# Patient Record
Sex: Male | Born: 2000 | Race: White | Hispanic: No | Marital: Single | Smoking: Current every day smoker
Health system: Southern US, Community
[De-identification: ages and names within clinical notes are randomized; demographics above are authoritative.]

## PROBLEM LIST (undated history)

## (undated) DIAGNOSIS — F909 Attention-deficit hyperactivity disorder, unspecified type: Secondary | ICD-10-CM

---
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
## 2023-04-09 ENCOUNTER — Ambulatory Visit
Admission: EM | Admit: 2023-04-09 | Discharge: 2023-04-09 | Disposition: A | Discharge status: Home / Self Care | Payer: Self-pay | Attending: Urgent Care | Admitting: Urgent Care

## 2023-04-09 DIAGNOSIS — F17210 Nicotine dependence, cigarettes, uncomplicated: Secondary | ICD-10-CM | POA: Insufficient documentation

## 2023-04-09 DIAGNOSIS — F17200 Nicotine dependence, unspecified, uncomplicated: Secondary | ICD-10-CM

## 2023-04-09 DIAGNOSIS — R112 Nausea with vomiting, unspecified: Secondary | ICD-10-CM | POA: Insufficient documentation

## 2023-04-09 DIAGNOSIS — Z1152 Encounter for screening for COVID-19: Secondary | ICD-10-CM | POA: Insufficient documentation

## 2023-04-09 DIAGNOSIS — J209 Acute bronchitis, unspecified: Secondary | ICD-10-CM | POA: Insufficient documentation

## 2023-04-09 HISTORY — DX: Attention-deficit hyperactivity disorder, unspecified type: F90.9

## 2023-04-09 LAB — POCT INFLUENZA A/B
Influenza A, POC: NEGATIVE
Influenza B, POC: NEGATIVE

## 2023-04-09 MED ORDER — ONDANSETRON 8 MG PO TBDP: 8.0000 mg | ORAL_TABLET | Freq: Three times a day (TID) | ORAL | 0 refills | Status: AC | PRN

## 2023-04-09 MED ORDER — ACETAMINOPHEN 325 MG PO TABS: 650.0000 mg | ORAL_TABLET | Freq: Four times a day (QID) | ORAL | 0 refills | Status: AC | PRN

## 2023-04-09 MED ORDER — PREDNISONE 20 MG PO TABS: ORAL_TABLET | ORAL | 0 refills | Status: AC

## 2023-04-09 MED ORDER — VENTOLIN HFA 108 (90 BASE) MCG/ACT IN AERS: 1.0000 | INHALATION_SPRAY | Freq: Four times a day (QID) | RESPIRATORY_TRACT | 0 refills | Status: AC | PRN

## 2023-04-09 MED ORDER — PROMETHAZINE-DM 6.25-15 MG/5ML PO SYRP: 5.0000 mL | ORAL_SOLUTION | Freq: Three times a day (TID) | ORAL | 0 refills | Status: AC | PRN

## 2023-04-09 NOTE — Discharge Instructions (Signed)
We will notify you of your test results as they arrive and may take between about 24 hours.  I encourage you to sign up for MyChart if you have not already done so as this can be the easiest way for [place] to communicate results to you online or through a phone app.  Generally, we only contact you if it is a positive test result.  In the meantime, if you develop worsening symptoms including fever, chest pain, shortness of breath despite our current treatment plan then please report to the emergency room as this may be a sign of worsening status from possible viral infection.  Otherwise, we will manage this as a viral bronchitis. For sore throat or cough try using a honey-based tea. Use 3 teaspoons of honey with juice squeezed from half lemon. Place shaved pieces of ginger into 1/2-1 cup of water and warm over stove top. Then mix the ingredients and repeat every 4 hours as needed. Please take Tylenol 500mg -650mg  every 6 hours for aches and pains, fevers. Hydrate very well with at least 2 liters of water. Eat light meals such as soups to replenish electrolytes and soft fruits, veggies. Start an antihistamine like Zyrtec (10mg  daily) for postnasal drainage, sinus congestion.  You can take this together with prednisone and albuterol.  Use the cough and nausea/vomiting medications as needed. Hold off on smoking as much as possible or use this as an opportunity to quit to avoid the long term effects of smoking like cancer, chronic bronchitis, emphysema.

## 2023-04-09 NOTE — ED Provider Notes (Signed)
[facility]  Note:  This document was prepared using Conservation officer, historic buildings and may include unintentional dictation errors.  MRN: [id number] DOB: 2001/03/04  Subjective:   Rueben Lins is a 22 y.o. male presenting for 1 day history of significant malaise, productive cough, body aches, belly pain, nausea with some vomiting. No fever, chest pain, wheezing. Has never had to use an inhaler, no history of asthma. Patient smokes cigarettes, marijuana and vapes.   No current facility-administered medications for this encounter.  Current Outpatient Medications:    ibuprofen (ADVIL,MOTRIN) 400 MG tablet, Take 1 tablet (400 mg total) by mouth every 6 (six) hours as needed., Disp: 30 tablet, Rfl: 0   No Known Allergies  Past Medical History:  Diagnosis Date   ADHD    per pt    History reviewed. No pertinent surgical history.  No family history on file.  Social History   Tobacco Use   Smoking status: Every Day    Types: Cigarettes   Smokeless tobacco: Never  Vaping Use   Vaping Use: Every day  Substance Use Topics   Alcohol use: Yes    Comment: occ   Drug use: Yes    Types: Marijuana    ROS   Objective:   Vitals: BP 131/82 (BP Location: Left Arm)   Pulse (!) 59   Temp 98 F (36.7 C) (Oral)   Resp 20   SpO2 97%   Physical Exam Constitutional:      General: He is not in acute distress.    Appearance: Normal appearance. He is well-developed and normal weight. He is not ill-appearing, toxic-appearing or diaphoretic.  HENT:     Head: Normocephalic and atraumatic.     Right Ear: Tympanic membrane, ear canal and external ear normal. No drainage, swelling or tenderness. No middle ear effusion. There is no impacted cerumen. Tympanic membrane is not erythematous or bulging.     Left Ear: Tympanic membrane, ear canal and external ear normal. No drainage, swelling or tenderness.  No middle ear effusion. There is no impacted cerumen.  Tympanic membrane is not erythematous or bulging.     Nose: Nose normal. No congestion or rhinorrhea.     Mouth/Throat:     Mouth: Mucous membranes are moist.     Pharynx: No oropharyngeal exudate or posterior oropharyngeal erythema.  Eyes:     General: No scleral icterus.       Right eye: No discharge.        Left eye: No discharge.     Extraocular Movements: Extraocular movements intact.     Conjunctiva/sclera: Conjunctivae normal.  Cardiovascular:     Rate and Rhythm: Normal rate and regular rhythm.     Heart sounds: Normal heart sounds. No murmur heard.    No friction rub. No gallop.  Pulmonary:     Effort: Pulmonary effort is normal. No respiratory distress.     Breath sounds: No stridor. Rhonchi (mild throughout) present. No wheezing or rales.  Abdominal:     General: Bowel sounds are normal. There is no distension.     Palpations: Abdomen is soft. There is no mass.     Tenderness: There is no abdominal tenderness. There is no right CVA tenderness, left CVA tenderness, guarding or rebound.  Musculoskeletal:     Cervical back: Normal range of motion and neck supple. No rigidity. No muscular tenderness.  Neurological:     General: No focal deficit present.     Mental Status:  He is alert and oriented to person, place, and time.  Psychiatric:        Mood and Affect: Mood normal.        Behavior: Behavior normal.        Thought Content: Thought content normal.        Judgment: Judgment normal.    Results for orders placed or performed during the hospital encounter of 04/09/23 (from the past 24 hour(s))  POCT Influenza A/B     Status: None   Collection Time: 04/09/23  9:54 AM  Result Value Ref Range   Influenza A, POC Negative Negative   Influenza B, POC Negative Negative    Assessment and Plan :   PDMP not reviewed this encounter.  1. Acute bronchitis, unspecified organism   2. Smoker   3. Nausea and vomiting, unspecified vomiting type    Patient has rhonchorous  lung sounds consistent with acute bronchitis although more likely with his smoking.  Recommended albuterol, and oral prednisone course.  Flu test negative.  Deferred imaging for now and patient is in agreement.  Otherwise, recommended supportive care. Offered scripts for symptomatic relief. Testing is pending. Counseled patient on potential for adverse effects with medications prescribed/recommended today, ER and return-to-clinic precautions discussed, patient verbalized understanding.   Patient should undergo Paxlovid treatment should he test positive for COVID-19.   Wallis Bamberg, PA-C 04/09/23 1008

## 2023-04-09 NOTE — ED Triage Notes (Signed)
Pt abd pain, n/v, body aches, prod cough-sx started yesterday-NAD-steady gait

## 2023-04-10 LAB — SARS CORONAVIRUS 2 (TAT 6-24 HRS): SARS Coronavirus 2: NEGATIVE
